# Patient Record
Sex: Male | Born: 1955 | Race: White | Hispanic: No | Marital: Married | Smoking: Never smoker
Health system: Southern US, Community
[De-identification: ages and names within clinical notes are randomized; demographics above are authoritative.]

## PROBLEM LIST (undated history)

## (undated) DIAGNOSIS — M7652 Patellar tendinitis, left knee: Secondary | ICD-10-CM

## (undated) DIAGNOSIS — E7800 Pure hypercholesterolemia, unspecified: Secondary | ICD-10-CM

## (undated) DIAGNOSIS — M4125 Other idiopathic scoliosis, thoracolumbar region: Secondary | ICD-10-CM

## (undated) DIAGNOSIS — I77810 Thoracic aortic ectasia: Secondary | ICD-10-CM

## (undated) DIAGNOSIS — Z86711 Personal history of pulmonary embolism: Secondary | ICD-10-CM

## (undated) HISTORY — PX: KNEE ARTHROSCOPY: SUR90

## (undated) HISTORY — PX: NECK SURGERY: SHX720

## (undated) HISTORY — PX: TONSILLECTOMY: SUR1361

## (undated) HISTORY — PX: OTHER SURGICAL HISTORY: SHX169

## (undated) HISTORY — PX: COLONOSCOPY: SHX174

---

## 2004-10-19 ENCOUNTER — Ambulatory Visit: Payer: Self-pay | Admitting: Family Medicine

## 2004-11-08 ENCOUNTER — Ambulatory Visit: Payer: Self-pay | Admitting: Family Medicine

## 2007-02-09 ENCOUNTER — Ambulatory Visit: Payer: Self-pay | Admitting: Family Medicine

## 2007-06-30 ENCOUNTER — Ambulatory Visit: Payer: Self-pay | Admitting: Gastroenterology

## 2008-01-19 ENCOUNTER — Ambulatory Visit: Payer: Self-pay | Admitting: Family Medicine

## 2010-08-07 ENCOUNTER — Other Ambulatory Visit: Payer: Self-pay | Admitting: Neurosurgery

## 2010-08-07 DIAGNOSIS — M545 Low back pain, unspecified: Secondary | ICD-10-CM

## 2010-08-07 DIAGNOSIS — M419 Scoliosis, unspecified: Secondary | ICD-10-CM

## 2010-08-08 ENCOUNTER — Ambulatory Visit
Admission: RE | Admit: 2010-08-08 | Discharge: 2010-08-08 | Disposition: A | Discharge status: Home / Self Care | Payer: BC Managed Care – PPO | Source: Ambulatory Visit | Attending: Neurosurgery | Admitting: Neurosurgery

## 2010-08-08 DIAGNOSIS — M545 Low back pain, unspecified: Secondary | ICD-10-CM

## 2010-08-08 DIAGNOSIS — M419 Scoliosis, unspecified: Secondary | ICD-10-CM

## 2011-07-17 ENCOUNTER — Ambulatory Visit: Payer: Self-pay | Admitting: Unknown Physician Specialty

## 2011-08-02 ENCOUNTER — Ambulatory Visit: Payer: Self-pay | Admitting: Unknown Physician Specialty

## 2014-05-26 ENCOUNTER — Ambulatory Visit: Payer: Self-pay | Admitting: Internal Medicine

## 2014-10-30 NOTE — Op Note (Signed)
PATIENT NAME:  Ronald Odom, Ronald Odom MR#:  [id number] DATE OF BIRTH:  1956-06-09  DATE OF PROCEDURE:  08/02/2011  PREOPERATIVE DIAGNOSIS: Torn medial meniscus, right knee.   POSTOPERATIVE DIAGNOSIS: Torn medial meniscus, right knee.   OPERATION: Arthroscopic partial medial meniscectomy.   SURGEON: Alda BertholdHarold B. Saesha Llerenas, Jr., MD   ANESTHESIA: General.   HISTORY: The patient had a long history of pain referable to the medial aspect of his right knee. His plain films did not reveal any significant joint space narrowing. MRI was consistent with a torn medial meniscus. He was ultimately brought in for surgery due to his persistent symptoms despite conservative treatment.   PROCEDURE: The patient was taken to the Operating Room where satisfactory general anesthesia was achieved. A tourniquet was then applied to his right thigh, and then a legholder was applied over this. The left lower extremity was supported with a well leg support system. The right knee was prepped and draped in the usual fashion for an arthroscopic procedure. I then introduced a cannula through a superomedial puncture wound into the knee joint. The joint was distended with lactated Ringer's. We used the Mitek fluid pump to facilitate joint distention.   The scope was introduced through an inferolateral puncture wound and a probe through an inferomedial puncture wound. Inspection of the medial compartment revealed that the medial compartment chondral surfaces were smooth. The patient had a degenerative type tear of the posterior horn of his medial meniscus. I went ahead and resected the tear using a combination of basket biters and a motorized resector. I then contoured the rim with an angled ArthroCare thermal wand.   Inspection of the intercondylar notch revealed that the cruciates were intact. Inspection of the lateral compartment failed to reveal any meniscal or chondral lesion. The scope was introduced into the posterior recess of the  medial and lateral compartments and no additional pathology was noted. The trochlear groove was smooth. The patella was observed primarily from the superomedial portal. Indeed the patella tracked well, and there was no significant retropatellar fibrillation.   The joint was decompressed through the cannulas at this time. The cannulas were removed. The puncture wounds were closed with 3-0 nylon in vertical mattress fashion.   I then injected each puncture wound with several milliliters of 0.5% Marcaine without epinephrine. Betadine was applied to the wounds, followed by a sterile dressing and an ice pack.   The patient was then awakened and transferred to his stretcher bed. He was taken to the recovery room in satisfactory condition. Blood loss was negligible. The tourniquet, incidentally, was not inflated during the course of the procedure.   ____________________________ Alda BertholdHarold B. Rodric Punch Jr., MD hbk:cbb D: 08/02/2011 15:09:59 ET T: 08/02/2011 16:21:28 ET JOB#: [id number]  cc: Alda BertholdHarold B. Amijah Timothy Jr., MD, <Dictator> Randon GoldsmithHAROLD B Keelynn Furgerson, Montez HagemanJR MD ELECTRONICALLY SIGNED 08/04/2011 15:36

## 2018-07-20 ENCOUNTER — Other Ambulatory Visit: Payer: Self-pay | Admitting: Internal Medicine

## 2018-07-20 DIAGNOSIS — R7989 Other specified abnormal findings of blood chemistry: Secondary | ICD-10-CM

## 2018-07-20 DIAGNOSIS — R0600 Dyspnea, unspecified: Secondary | ICD-10-CM

## 2018-07-24 ENCOUNTER — Encounter (INDEPENDENT_AMBULATORY_CARE_PROVIDER_SITE_OTHER): Payer: Self-pay

## 2018-07-24 ENCOUNTER — Ambulatory Visit
Admission: RE | Admit: 2018-07-24 | Discharge: 2018-07-24 | Disposition: A | Discharge status: Home / Self Care | Payer: BLUE CROSS/BLUE SHIELD | Source: Ambulatory Visit | Attending: Internal Medicine | Admitting: Internal Medicine

## 2018-07-24 DIAGNOSIS — R0600 Dyspnea, unspecified: Secondary | ICD-10-CM | POA: Diagnosis not present

## 2018-07-24 DIAGNOSIS — R7989 Other specified abnormal findings of blood chemistry: Secondary | ICD-10-CM | POA: Diagnosis present

## 2018-07-24 MED ORDER — IOPAMIDOL (ISOVUE-370) INJECTION 76%
75.0000 mL | Freq: Once | INTRAVENOUS | Status: AC | PRN
  Administered 2018-07-24: 75 mL via INTRAVENOUS

## 2019-05-10 ENCOUNTER — Other Ambulatory Visit: Payer: Self-pay | Admitting: Internal Medicine

## 2019-05-10 DIAGNOSIS — I712 Thoracic aortic aneurysm, without rupture, unspecified: Secondary | ICD-10-CM

## 2019-05-18 ENCOUNTER — Ambulatory Visit: Admission: RE | Admit: 2019-05-18 | Payer: BC Managed Care – PPO | Source: Ambulatory Visit

## 2019-05-18 ENCOUNTER — Ambulatory Visit: Payer: BC Managed Care – PPO

## 2019-07-22 ENCOUNTER — Ambulatory Visit: Admission: RE | Admit: 2019-07-22 | Payer: BC Managed Care – PPO | Source: Ambulatory Visit

## 2019-07-27 ENCOUNTER — Other Ambulatory Visit: Payer: Self-pay | Admitting: Internal Medicine

## 2019-07-27 DIAGNOSIS — I712 Thoracic aortic aneurysm, without rupture, unspecified: Secondary | ICD-10-CM

## 2019-07-29 ENCOUNTER — Ambulatory Visit: Admission: RE | Admit: 2019-07-29 | Payer: BC Managed Care – PPO | Source: Ambulatory Visit

## 2019-08-04 ENCOUNTER — Other Ambulatory Visit
Admission: RE | Admit: 2019-08-04 | Discharge: 2019-08-04 | Disposition: A | Discharge status: Home / Self Care | Payer: BC Managed Care – PPO | Source: Ambulatory Visit | Attending: Internal Medicine | Admitting: Internal Medicine

## 2019-08-04 ENCOUNTER — Ambulatory Visit
Admission: RE | Admit: 2019-08-04 | Discharge: 2019-08-04 | Disposition: A | Discharge status: Home / Self Care | Payer: BC Managed Care – PPO | Source: Ambulatory Visit | Attending: Internal Medicine | Admitting: Internal Medicine

## 2019-08-04 ENCOUNTER — Other Ambulatory Visit: Payer: Self-pay

## 2019-08-04 DIAGNOSIS — I712 Thoracic aortic aneurysm, without rupture, unspecified: Secondary | ICD-10-CM

## 2019-08-04 LAB — CREATININE, SERUM
Creatinine, Ser: 0.78 mg/dL (ref 0.61–1.24)
GFR calc Af Amer: >60 mL/min (ref >60)
GFR calc non Af Amer: >60 mL/min (ref >60)

## 2019-08-04 MED ORDER — IOHEXOL 350 MG/ML SOLN
100.0000 mL | Freq: Once | INTRAVENOUS | Status: AC | PRN
  Administered 2019-08-04: 100 mL via INTRAVENOUS

## 2020-10-23 ENCOUNTER — Other Ambulatory Visit: Payer: Self-pay | Admitting: Internal Medicine

## 2020-10-23 DIAGNOSIS — I77810 Thoracic aortic ectasia: Secondary | ICD-10-CM

## 2020-10-23 DIAGNOSIS — Z0000 Encounter for general adult medical examination without abnormal findings: Secondary | ICD-10-CM

## 2020-11-02 ENCOUNTER — Ambulatory Visit
Admission: RE | Admit: 2020-11-02 | Discharge: 2020-11-02 | Disposition: A | Discharge status: Home / Self Care | Payer: BC Managed Care – PPO | Source: Ambulatory Visit | Attending: Internal Medicine | Admitting: Internal Medicine

## 2020-11-02 ENCOUNTER — Other Ambulatory Visit: Payer: Self-pay

## 2020-11-02 DIAGNOSIS — Z0000 Encounter for general adult medical examination without abnormal findings: Secondary | ICD-10-CM

## 2020-11-02 DIAGNOSIS — I77810 Thoracic aortic ectasia: Secondary | ICD-10-CM

## 2020-11-02 MED ORDER — IOHEXOL 350 MG/ML SOLN
150.0000 mL | Freq: Once | INTRAVENOUS | Status: AC | PRN
  Administered 2020-11-02: 125 mL via INTRAVENOUS

## 2021-06-04 IMAGING — CT CT CTA ABD/PEL W/CM AND/OR W/O CM
1 of 4 series · 11 of 32 positions shown, 16 images · IV contrast (APPLIED)
Comparison: 08/04/2019, 07/24/2018

CLINICAL DATA: 64-year-old male with a history of aortic ectasia

EXAM:
CT ANGIOGRAPHY CHEST, ABDOMEN AND PELVIS
TECHNIQUE: Non-contrast CT of the chest was initially obtained.

[Series 4: axial arterial · axial · arterial · 0.88mm/px · z∈[-1168,-544]mm · 11 of 244 slices shown, 16 images]
[im 18/244  soft-tissue]
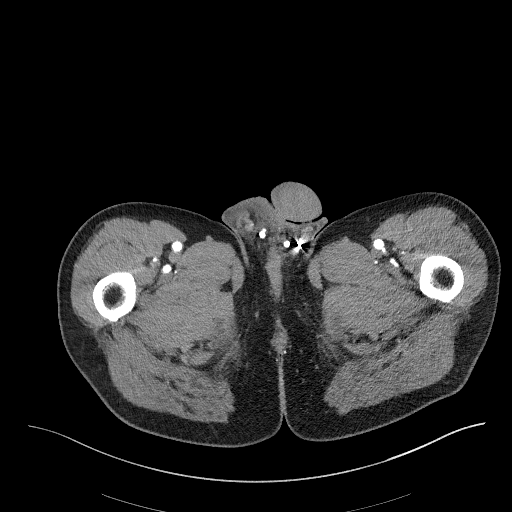
[im 18/244  bone]
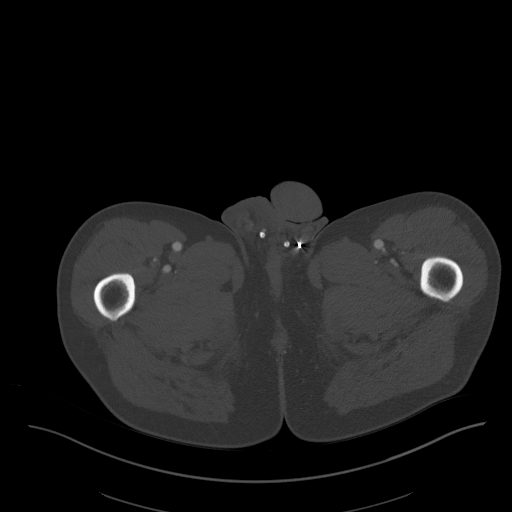
[im 35/244  soft-tissue]
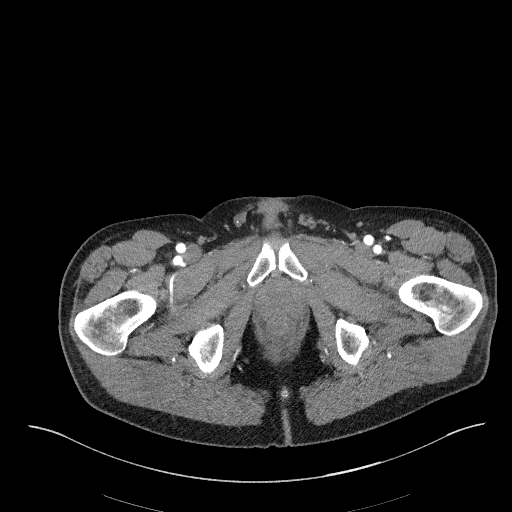
[im 70/244  soft-tissue]
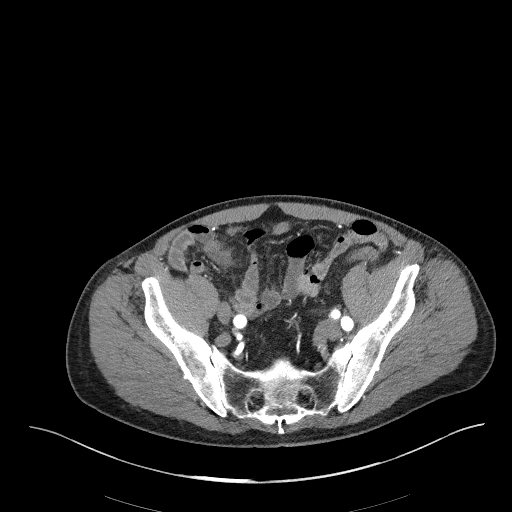
[im 87/244  soft-tissue]
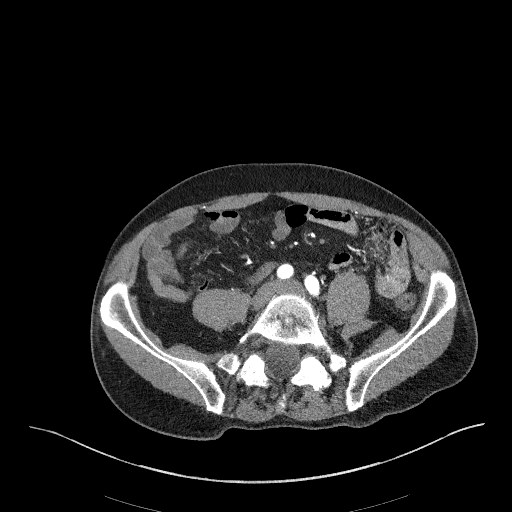
[im 105/244  soft-tissue]
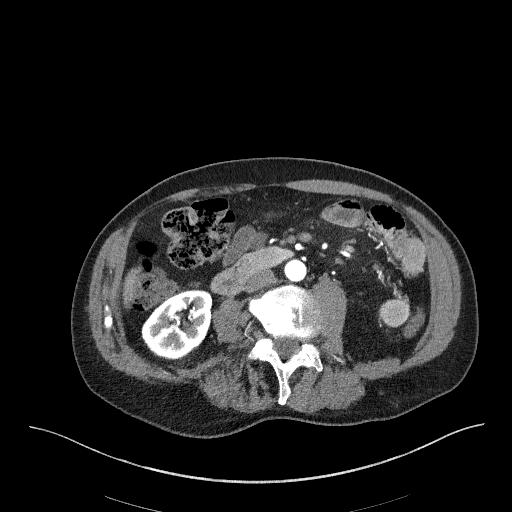
[im 139/244  soft-tissue]
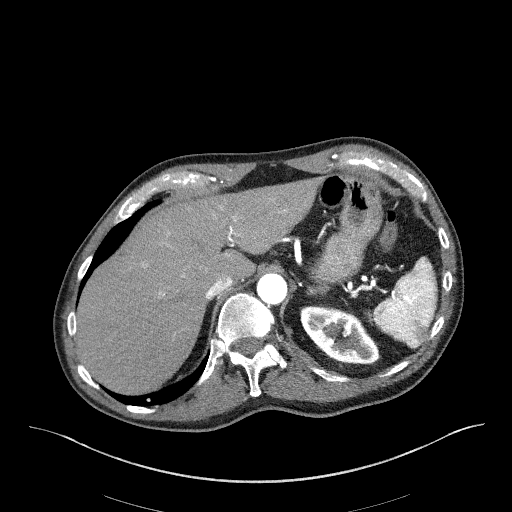
[im 157/244  soft-tissue]
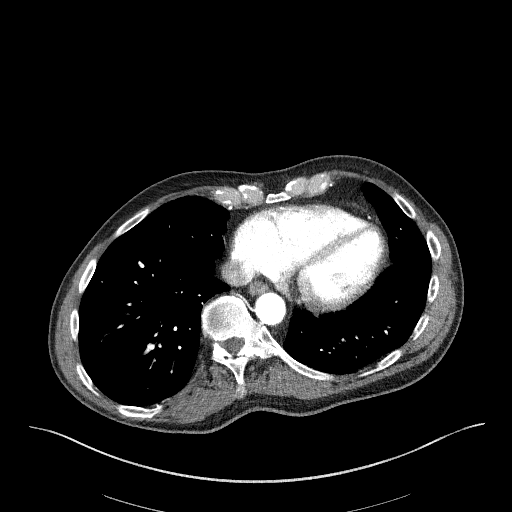
[im 174/244  soft-tissue]
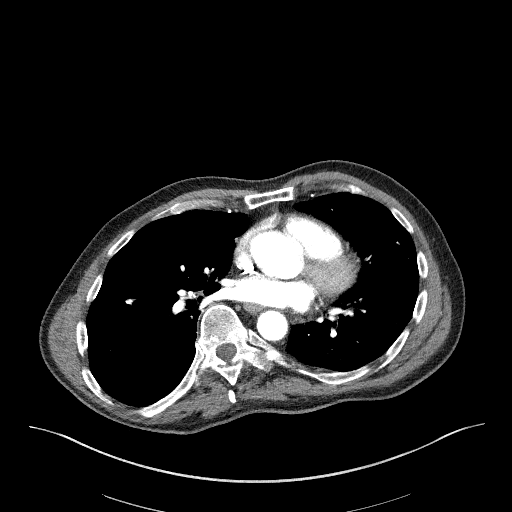
[im 174/244  lung]
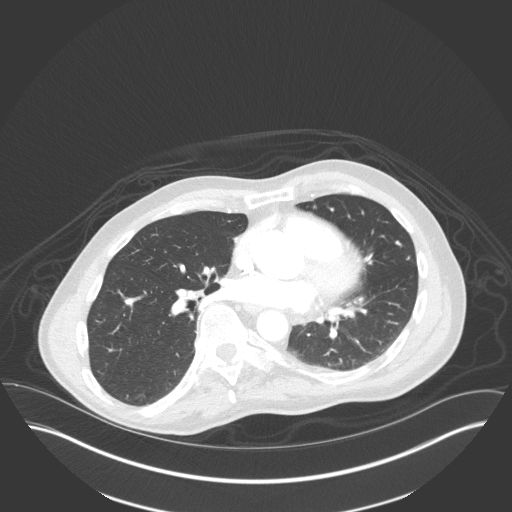
[im 191/244  lung]
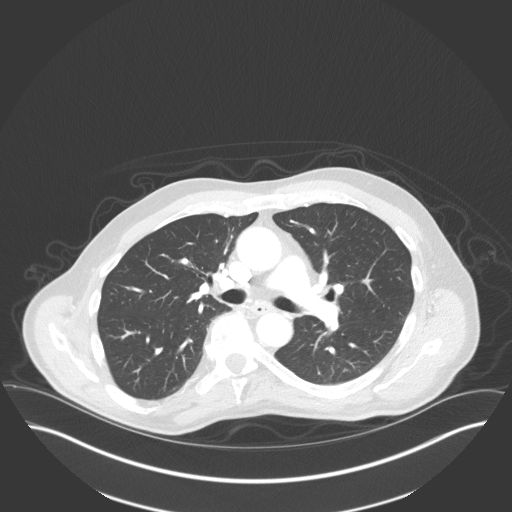
[im 209/244  soft-tissue]
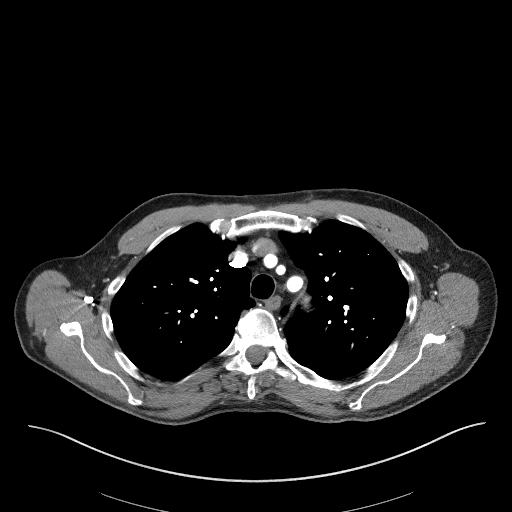
[im 209/244  lung]
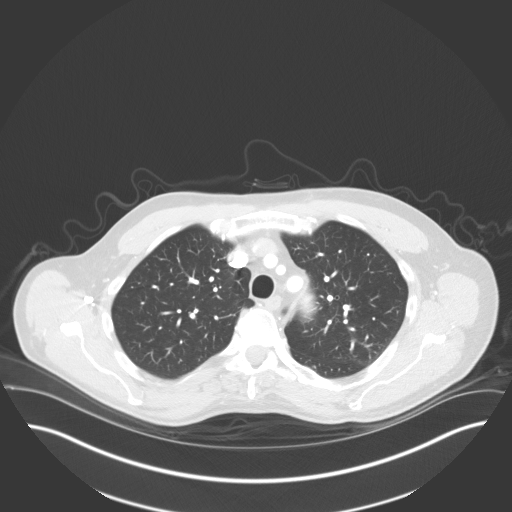
[im 209/244  bone]
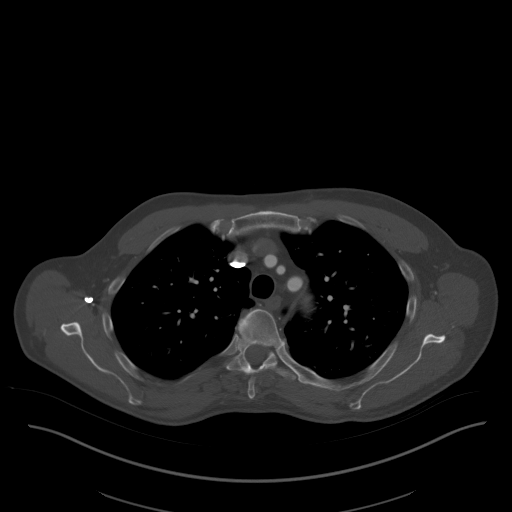
[im 226/244  soft-tissue]
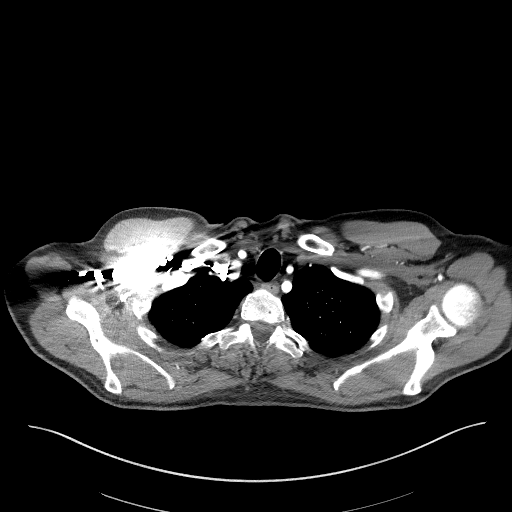
[im 226/244  lung]
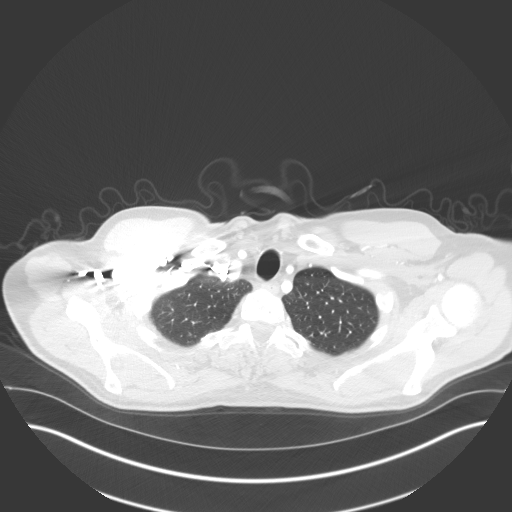

[11 of 32 positions shown; findings below may reference images not displayed]

Multidetector CT imaging through the chest, abdomen and pelvis was
performed using the standard protocol during bolus administration of
intravenous contrast. Multiplanar reconstructed images and MIPs were
obtained and reviewed to evaluate the vascular anatomy.

CONTRAST:  125mL OMNIPAQUE IOHEXOL 350 MG/ML SOLN
FINDINGS: CTA CHEST FINDINGS

Cardiovascular:

Heart:

No cardiomegaly. No pericardial fluid/thickening. Atherosclerotic
changes of the left anterior descending coronary artery.

Aorta:

No significant aortic valve calcification. The maximum estimated
diameter of the ascending aorta 3.7 cm on the current CT.

No dissection. No significant atherosclerotic plaque. No periaortic
inflammatory changes.

Three vessel arch. Branch vessels are patent. Cervical cerebral
vessels patent at the base of the neck.

Pulmonary arteries:

Timing of the contrast bolus is not optimized for evaluation of
pulmonary artery filling defects.

Mediastinum/Nodes: No mediastinal adenopathy. Unremarkable
appearance of the thoracic esophagus.

Unremarkable appearance of the thoracic inlet.

Lungs/Pleura: Central airways are clear. Early bronchiectasis
bilaterally. No significant bronchial wall thickening. No confluent
airspace disease.

No pneumothorax or pleural effusion. Minimal atelectasis at the lung
bases.

CTA ABDOMEN AND PELVIS FINDINGS

VASCULAR

Aorta: Unremarkable course, caliber, contour of the abdominal aorta.
No dissection, aneurysm, or periaortic fluid. Minimal
atherosclerotic changes.

Celiac: Patent, with no significant atherosclerotic changes.

SMA: Patent, with no significant atherosclerotic changes.

Renals:

- Right: Single right renal artery. No atherosclerotic changes at
the right renal artery origin. There is beading/corrugation along
the length of the main renal artery, extending to the divisions in
the renal hilum.

- Left: Single left renal artery. No perceived beading/corrugation
of the left renal artery. No atherosclerotic changes.

IMA: Inferior mesenteric artery is patent.

Right lower extremity:

No significant atherosclerotic changes of the right iliac system.
Mild to moderate tortuosity. Minimal atherosclerosis at the origin
of the hypogastric artery. Branch vessels of the pelvis remain
patent.

Common femoral artery patent. Proximal profunda femoris and SFA
patent.

Left lower extremity:

No significant atherosclerotic changes of the left iliac system.
Mild to moderate tortuosity. Minimal atherosclerosis in the
hypogastric artery distribution. Pelvic arteries are patent.

Common femoral artery patent.

Proximal profunda femoris and SFA patent.

Veins: Unremarkable appearance of the venous system.

Review of the MIP images confirms the above findings.

NON-VASCULAR

Hepatobiliary: Unremarkable appearance of the liver. Unremarkable
gall bladder.

Pancreas: Unremarkable.

Spleen: Unremarkable.

Adrenals/Urinary Tract:

- Right adrenal gland: Unremarkable

- Left adrenal gland: Unremarkable.

- Right kidney: No hydronephrosis, nephrolithiasis, inflammation, or
ureteral dilation. No focal lesion.

- Left Kidney: No hydronephrosis, nephrolithiasis, inflammation, or
ureteral dilation. Cyst of the medial cortex of the left kidney
measuring 15 mm.

- Urinary Bladder: Unremarkable.

Stomach/Bowel:

- Stomach: Unremarkable.

- Small bowel: Unremarkable

- Appendix: Normal.

- Colon: Colonic diverticula of the left colon. No inflammatory
changes.

Lymphatic: Small lymph nodes of the mesentery. No retroperitoneal or
pelvic adenopathy. No inguinal adenopathy.

Mesenteric: No free fluid or air. No mesenteric adenopathy.

Reproductive: Transverse diameter of the prostate estimated 5.5 cm.
Pelvic phleboliths of the scrotum and within the pelvis

Other: No hernia.

Musculoskeletal: Scoliotic curvature of the thoracolumbar spine is
similar to the comparison. No aggressive sclerotic or lucent
lesions. T8 intervertebral hemangioma. No acute displaced fracture.
Degenerative changes of the lumbar spine. No significant bony canal
narrowing
IMPRESSION: No acute CT finding.

No significant change in the size/appearance of the thoracic aorta,
with the greatest diameter estimated on the current CT 3.7 cm.

Additional ancillary findings as above.

## 2022-05-01 ENCOUNTER — Other Ambulatory Visit: Payer: Self-pay | Admitting: Internal Medicine

## 2022-05-01 DIAGNOSIS — E7800 Pure hypercholesterolemia, unspecified: Secondary | ICD-10-CM

## 2022-05-01 DIAGNOSIS — I77810 Thoracic aortic ectasia: Secondary | ICD-10-CM

## 2022-05-06 ENCOUNTER — Ambulatory Visit
Admission: RE | Admit: 2022-05-06 | Discharge: 2022-05-06 | Disposition: A | Discharge status: Home / Self Care | Payer: BC Managed Care – PPO | Source: Ambulatory Visit | Attending: Internal Medicine | Admitting: Internal Medicine

## 2022-05-06 DIAGNOSIS — I77810 Thoracic aortic ectasia: Secondary | ICD-10-CM | POA: Insufficient documentation

## 2022-05-06 DIAGNOSIS — E7800 Pure hypercholesterolemia, unspecified: Secondary | ICD-10-CM | POA: Insufficient documentation

## 2022-05-06 MED ORDER — IOHEXOL 350 MG/ML SOLN
75.0000 mL | Freq: Once | INTRAVENOUS | Status: AC | PRN
  Administered 2022-05-06: 75 mL via INTRAVENOUS

## 2022-11-05 ENCOUNTER — Encounter: Payer: Self-pay | Admitting: *Deleted

## 2022-11-11 ENCOUNTER — Encounter: Payer: Self-pay | Admitting: *Deleted

## 2022-11-12 ENCOUNTER — Ambulatory Visit: Payer: Medicare PPO | Admitting: Anesthesiology

## 2022-11-12 ENCOUNTER — Ambulatory Visit
Admission: RE | Admit: 2022-11-12 | Discharge: 2022-11-12 | Disposition: A | Discharge status: Home / Self Care | Payer: Medicare PPO | Attending: Gastroenterology | Admitting: Gastroenterology

## 2022-11-12 ENCOUNTER — Encounter: Payer: Self-pay | Admitting: *Deleted

## 2022-11-12 ENCOUNTER — Encounter
Admission: RE | Disposition: A | Discharge status: Home / Self Care | Payer: Self-pay | Source: Home / Self Care | Attending: Gastroenterology

## 2022-11-12 DIAGNOSIS — Z09 Encounter for follow-up examination after completed treatment for conditions other than malignant neoplasm: Secondary | ICD-10-CM | POA: Insufficient documentation

## 2022-11-12 DIAGNOSIS — K640 First degree hemorrhoids: Secondary | ICD-10-CM | POA: Insufficient documentation

## 2022-11-12 DIAGNOSIS — Z1211 Encounter for screening for malignant neoplasm of colon: Secondary | ICD-10-CM | POA: Diagnosis present

## 2022-11-12 DIAGNOSIS — Z86711 Personal history of pulmonary embolism: Secondary | ICD-10-CM | POA: Insufficient documentation

## 2022-11-12 DIAGNOSIS — E7800 Pure hypercholesterolemia, unspecified: Secondary | ICD-10-CM | POA: Diagnosis not present

## 2022-11-12 DIAGNOSIS — K5730 Diverticulosis of large intestine without perforation or abscess without bleeding: Secondary | ICD-10-CM | POA: Diagnosis not present

## 2022-11-12 HISTORY — PX: COLONOSCOPY WITH PROPOFOL: SHX5780

## 2022-11-12 HISTORY — DX: Personal history of pulmonary embolism: Z86.711

## 2022-11-12 HISTORY — DX: Thoracic aortic ectasia: I77.810

## 2022-11-12 HISTORY — DX: Pure hypercholesterolemia, unspecified: E78.00

## 2022-11-12 HISTORY — DX: Patellar tendinitis, left knee: M76.52

## 2022-11-12 HISTORY — DX: Other idiopathic scoliosis, thoracolumbar region: M41.25

## 2022-11-12 SURGERY — COLONOSCOPY WITH PROPOFOL
Anesthesia: General

## 2022-11-12 MED ORDER — PROPOFOL 10 MG/ML IV BOLUS
INTRAVENOUS | Status: DC | PRN
  Administered 2022-11-12: 20 mg via INTRAVENOUS
  Administered 2022-11-12: 70 mg via INTRAVENOUS
  Administered 2022-11-12: 30 mg via INTRAVENOUS

## 2022-11-12 MED ORDER — SODIUM CHLORIDE 0.9 % IV SOLN
INTRAVENOUS | Status: DC
  Administered 2022-11-12: 1000 mL via INTRAVENOUS

## 2022-11-12 MED ORDER — PROPOFOL 500 MG/50ML IV EMUL
INTRAVENOUS | Status: DC | PRN
  Administered 2022-11-12: 165 ug/kg/min via INTRAVENOUS

## 2022-11-12 MED ORDER — LIDOCAINE HCL (CARDIAC) PF 100 MG/5ML IV SOSY
PREFILLED_SYRINGE | INTRAVENOUS | Status: DC | PRN
  Administered 2022-11-12: 100 mg via INTRAVENOUS

## 2022-11-12 MED ORDER — MIDAZOLAM HCL 2 MG/2ML IJ SOLN
INTRAMUSCULAR | Status: DC | PRN
  Administered 2022-11-12: 2 mg via INTRAVENOUS

## 2022-11-12 MED ORDER — MIDAZOLAM HCL 2 MG/2ML IJ SOLN
INTRAMUSCULAR | Status: AC
  Filled 2022-11-12: qty 2

## 2022-11-12 NOTE — H&P (Signed)
Outpatient short stay form Pre-procedure 11/12/2022  Ronald Bill, MD  Primary Physician: Lynnea Ferrier, MD  Reason for visit:  Screening  History of present illness:    67 y/o gentleman with history of hld and PE that was treated with 3 months of DOAC here for screening colonoscopy. No blood thinners. No family history of GI malignancies. No significant abdominal surgeries.    Current Facility-Administered Medications:    0.9 %  sodium chloride infusion, , Intravenous, Continuous, Tayvian Holycross, Rossie Muskrat, MD, Last Rate: 20 mL/hr at 11/12/22 1213, 1,000 mL at 11/12/22 1213  Medications Prior to Admission  Medication Sig Dispense Refill Last Dose   Ascorbic Acid (VITAMIN C) 1000 MG tablet Take 1,000 mg by mouth daily.      aspirin EC 81 MG tablet Take 81 mg by mouth daily. Swallow whole.   Past Week   cholecalciferol (VITAMIN D3) 10 MCG (400 UNIT) TABS tablet Take 1,000 Units by mouth.      metoprolol tartrate (LOPRESSOR) 25 MG tablet Take 25 mg by mouth 2 (two) times daily.   Past Week   rosuvastatin (CRESTOR) 10 MG tablet Take 10 mg by mouth daily.   Past Week   zinc gluconate 50 MG tablet Take 50 mg by mouth daily.        Allergies  Allergen Reactions   Atorvastatin Other (See Comments)    Muscle pain   Lactose Intolerance (Gi) Diarrhea     Past Medical History:  Diagnosis Date   Aortic ectasia, thoracic (HCC)    History of pulmonary embolism    Hypercholesteremia    Idiopathic scoliosis of thoracolumbar spine    Patellar tendinitis of left knee     Review of systems:  Otherwise negative.    Physical Exam  Gen: Alert, oriented. Appears stated age.  HEENT:  PERRLA. Lungs: No respiratory distress CV: RRR Abd: soft, benign, no masses Ext: No edema    Planned procedures: Proceed with colonoscopy. The patient understands the nature of the planned procedure, indications, risks, alternatives and potential complications including but not limited to bleeding,  infection, perforation, damage to internal organs and possible oversedation/side effects from anesthesia. The patient agrees and gives consent to proceed.  Please refer to procedure notes for findings, recommendations and patient disposition/instructions.     Ronald Bill, MD [facility]

## 2022-11-12 NOTE — Op Note (Signed)
[facility] Patient Name: Ronald Odom Procedure Date: 11/12/2022 12:46 PM MRN: [id number] Account #: [id number] Date of Birth: 1955/10/24 Admit Type: Outpatient Age: 67 Room: [facility] ENDO ROOM 1 Gender: Male Note Status: Finalized Instrument Name: Prentice Docker [id number] Procedure:             Colonoscopy Indications:           Screening for colorectal malignant neoplasm Providers:             Eather Colas MD, MD Medicines:             Monitored Anesthesia Care Complications:         No immediate complications. Procedure:             Pre-Anesthesia Assessment:                        - Prior to the procedure, a History and Physical was                         performed, and patient medications and allergies were                         reviewed. The patient is competent. The risks and                         benefits of the procedure and the sedation options and                         risks were discussed with the patient. All questions                         were answered and informed consent was obtained.                         Patient identification and proposed procedure were                         verified by the physician, the nurse, the                         anesthesiologist, the anesthetist and the technician                         in the endoscopy suite. Mental Status Examination:                         alert and oriented. Airway Examination: normal                         oropharyngeal airway and neck mobility. Respiratory                         Examination: clear to auscultation. CV Examination:                         normal. Prophylactic Antibiotics: The patient does not                         require prophylactic antibiotics. Prior  Anticoagulants: The patient has taken no anticoagulant                         or antiplatelet agents. ASA Grade Assessment: II - A                         patient with mild  systemic disease. After reviewing                         the risks and benefits, the patient was deemed in                         satisfactory condition to undergo the procedure. The                         anesthesia plan was to use monitored anesthesia care                         (MAC). Immediately prior to administration of                         medications, the patient was re-assessed for adequacy                         to receive sedatives. The heart rate, respiratory                         rate, oxygen saturations, blood pressure, adequacy of                         pulmonary ventilation, and response to care were                         monitored throughout the procedure. The physical                         status of the patient was re-assessed after the                         procedure.                        After obtaining informed consent, the colonoscope was                         passed under direct vision. Throughout the procedure,                         the patient's blood pressure, pulse, and oxygen                         saturations were monitored continuously. The                         Colonoscope was introduced through the anus and                         advanced to the the terminal ileum. The colonoscopy  was performed without difficulty. The patient                         tolerated the procedure well. The quality of the bowel                         preparation was good. The terminal ileum, ileocecal                         valve, appendiceal orifice, and rectum were                         photographed. Findings:      The perianal and digital rectal examinations were normal.      The terminal ileum appeared normal.      Multiple small-mouthed diverticula were found in the sigmoid colon and       descending colon.      Internal hemorrhoids were found during retroflexion. The hemorrhoids       were Grade I (internal hemorrhoids  that do not prolapse).      The exam was otherwise without abnormality on direct and retroflexion       views. Impression:            - The examined portion of the ileum was normal.                        - Diverticulosis in the sigmoid colon and in the                         descending colon.                        - Internal hemorrhoids.                        - The examination was otherwise normal on direct and                         retroflexion views.                        - No specimens collected. Recommendation:        - Discharge patient to home.                        - Resume previous diet.                        - Continue present medications.                        - Repeat colonoscopy in 10 years for screening                         purposes.                        - Return to referring physician as previously                         scheduled. Procedure Code(s):     --- Professional ---  Z6109, Colorectal cancer screening; colonoscopy on                         individual not meeting criteria for high risk Diagnosis Code(s):     --- Professional ---                        Z12.11, Encounter for screening for malignant neoplasm                         of colon                        K64.0, First degree hemorrhoids                        K57.30, Diverticulosis of large intestine without                         perforation or abscess without bleeding CPT copyright 2022 [employer]. All rights reserved. The codes documented in this report are preliminary and upon coder review may  be revised to meet current compliance requirements. Eather Colas MD, MD 11/12/2022 1:15:25 PM Number of Addenda: 0 Note Initiated On: 11/12/2022 12:46 PM Scope Withdrawal Time: 0 hours 8 minutes 52 seconds  Total Procedure Duration: 0 hours 13 minutes 44 seconds  Estimated Blood Loss:  Estimated blood loss: none.      [facility]

## 2022-11-12 NOTE — Transfer of Care (Signed)
Immediate Anesthesia Transfer of Care Note  Patient: Ronald Odom  Procedure(s) Performed: COLONOSCOPY WITH PROPOFOL  Patient Location: Endoscopy Unit  Anesthesia Type:General  Level of Consciousness: drowsy and patient cooperative  Airway & Oxygen Therapy: Patient Spontanous Breathing and Patient connected to face mask oxygen  Post-op Assessment: Report given to RN and Post -op Vital signs reviewed and stable  Post vital signs: Reviewed and stable  Last Vitals:  Vitals Value Taken Time  BP 105/78 11/12/22 1305  Temp    Pulse 96 11/12/22 1306  Resp 13 11/12/22 1306  SpO2 99 % 11/12/22 1306  Vitals shown include unvalidated device data.  Last Pain:  Vitals:   11/12/22 1206  TempSrc: Temporal  PainSc: 0-No pain         Complications: No notable events documented.

## 2022-11-12 NOTE — Anesthesia Preprocedure Evaluation (Signed)
Anesthesia Evaluation  Patient identified by MRN, date of birth, ID band Patient awake    Reviewed: Allergy & Precautions, H&P , NPO status , Patient's Chart, lab work & pertinent test results, reviewed documented beta blocker date and time   History of Anesthesia Complications Negative for: history of anesthetic complications  Airway Mallampati: II  TM Distance: >3 FB Neck ROM: full    Dental  (+) Caps, Dental Advidsory Given, Teeth Intact   Pulmonary neg pulmonary ROS   Pulmonary exam normal breath sounds clear to auscultation       Cardiovascular Exercise Tolerance: Good negative cardio ROS Normal cardiovascular exam Rhythm:regular Rate:Normal     Neuro/Psych negative neurological ROS  negative psych ROS   GI/Hepatic negative GI ROS, Neg liver ROS,,,  Endo/Other  negative endocrine ROS    Renal/GU negative Renal ROS  negative genitourinary   Musculoskeletal   Abdominal   Peds  Hematology negative hematology ROS (+)   Anesthesia Other Findings Past Medical History: No date: Aortic ectasia, thoracic (HCC) No date: History of pulmonary embolism No date: Hypercholesteremia No date: Idiopathic scoliosis of thoracolumbar spine No date: Patellar tendinitis of left knee   Reproductive/Obstetrics negative OB ROS                             Anesthesia Physical Anesthesia Plan  ASA: 2  Anesthesia Plan: General   Post-op Pain Management:    Induction: Intravenous  PONV Risk Score and Plan: 2 and Propofol infusion and TIVA  Airway Management Planned: Natural Airway and Nasal Cannula  Additional Equipment:   Intra-op Plan:   Post-operative Plan:   Informed Consent: I have reviewed the patients History and Physical, chart, labs and discussed the procedure including the risks, benefits and alternatives for the proposed anesthesia with the patient or authorized representative who  has indicated his/her understanding and acceptance.     Dental Advisory Given  Plan Discussed with: Anesthesiologist, CRNA and Surgeon  Anesthesia Plan Comments:        Anesthesia Quick Evaluation

## 2022-11-12 NOTE — Interval H&P Note (Signed)
History and Physical Interval Note:  11/12/2022 12:40 PM  Ronald Odom  has presented today for surgery, with the diagnosis of CCA Screen.  The various methods of treatment have been discussed with the patient and family. After consideration of risks, benefits and other options for treatment, the patient has consented to  Procedure(s): COLONOSCOPY WITH PROPOFOL (N/A) as a surgical intervention.  The patient's history has been reviewed, patient examined, no change in status, stable for surgery.  I have reviewed the patient's chart and labs.  Questions were answered to the patient's satisfaction.     Regis Bill  Ok to proceed with colonoscopy

## 2022-11-12 NOTE — Anesthesia Procedure Notes (Signed)
Procedure Name: General with mask airway Date/Time: 11/12/2022 12:54 PM  Performed by: Mohammed Kindle, CRNAPre-anesthesia Checklist: Patient identified, Emergency Drugs available, Suction available and Patient being monitored Patient Re-evaluated:Patient Re-evaluated prior to induction Oxygen Delivery Method: Simple face mask Induction Type: IV induction Placement Confirmation: positive ETCO2, CO2 detector and breath sounds checked- equal and bilateral Dental Injury: Teeth and Oropharynx as per pre-operative assessment

## 2022-11-13 ENCOUNTER — Encounter: Payer: Self-pay | Admitting: Gastroenterology

## 2022-11-16 NOTE — Anesthesia Postprocedure Evaluation (Signed)
Anesthesia Post Note  Patient: Ronald Odom  Procedure(s) Performed: COLONOSCOPY WITH PROPOFOL  Patient location during evaluation: Endoscopy Anesthesia Type: General Level of consciousness: awake and alert Pain management: pain level controlled Vital Signs Assessment: post-procedure vital signs reviewed and stable Respiratory status: spontaneous breathing, nonlabored ventilation, respiratory function stable and patient connected to nasal cannula oxygen Cardiovascular status: blood pressure returned to baseline and stable Postop Assessment: no apparent nausea or vomiting Anesthetic complications: no   No notable events documented.   Last Vitals:  Vitals:   11/12/22 1315 11/12/22 1325  BP: (!) 121/90 (!) 135/90  Pulse:    Resp:    Temp:    SpO2:      Last Pain:  Vitals:   11/12/22 1325  TempSrc:   PainSc: 0-No pain                 Lenard Simmer
